# Patient Record
Sex: Female | Born: 1985 | Hispanic: Yes | Marital: Single | Smoking: Never smoker
Health system: Southern US, Community
[De-identification: ages and names within clinical notes are randomized; demographics above are authoritative.]

## PROBLEM LIST (undated history)

## (undated) DIAGNOSIS — D649 Anemia, unspecified: Secondary | ICD-10-CM

## (undated) HISTORY — DX: Anemia, unspecified: D64.9

---
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
## 2021-01-05 ENCOUNTER — Encounter: Payer: Self-pay | Admitting: *Deleted

## 2021-01-05 ENCOUNTER — Other Ambulatory Visit: Payer: Self-pay

## 2021-01-05 ENCOUNTER — Emergency Department: Payer: Worker's Compensation

## 2021-01-05 DIAGNOSIS — W010XXA Fall on same level from slipping, tripping and stumbling without subsequent striking against object, initial encounter: Secondary | ICD-10-CM | POA: Insufficient documentation

## 2021-01-05 DIAGNOSIS — Y990 Civilian activity done for income or pay: Secondary | ICD-10-CM | POA: Insufficient documentation

## 2021-01-05 DIAGNOSIS — S8992XA Unspecified injury of left lower leg, initial encounter: Secondary | ICD-10-CM | POA: Diagnosis present

## 2021-01-05 DIAGNOSIS — S8002XA Contusion of left knee, initial encounter: Secondary | ICD-10-CM | POA: Insufficient documentation

## 2021-01-05 IMAGING — CR DG KNEE COMPLETE 4+V*L*
1 series · 4 of 4 positions shown · non-contrast
Comparison: None.

CLINICAL DATA: Injury to the left knee.

EXAM:
LEFT KNEE - COMPLETE 4+ VIEW

[Series 1: dg knee complete 4 views left · 0.14mm/px · 4 of 4 slices shown]
[im 1/4]
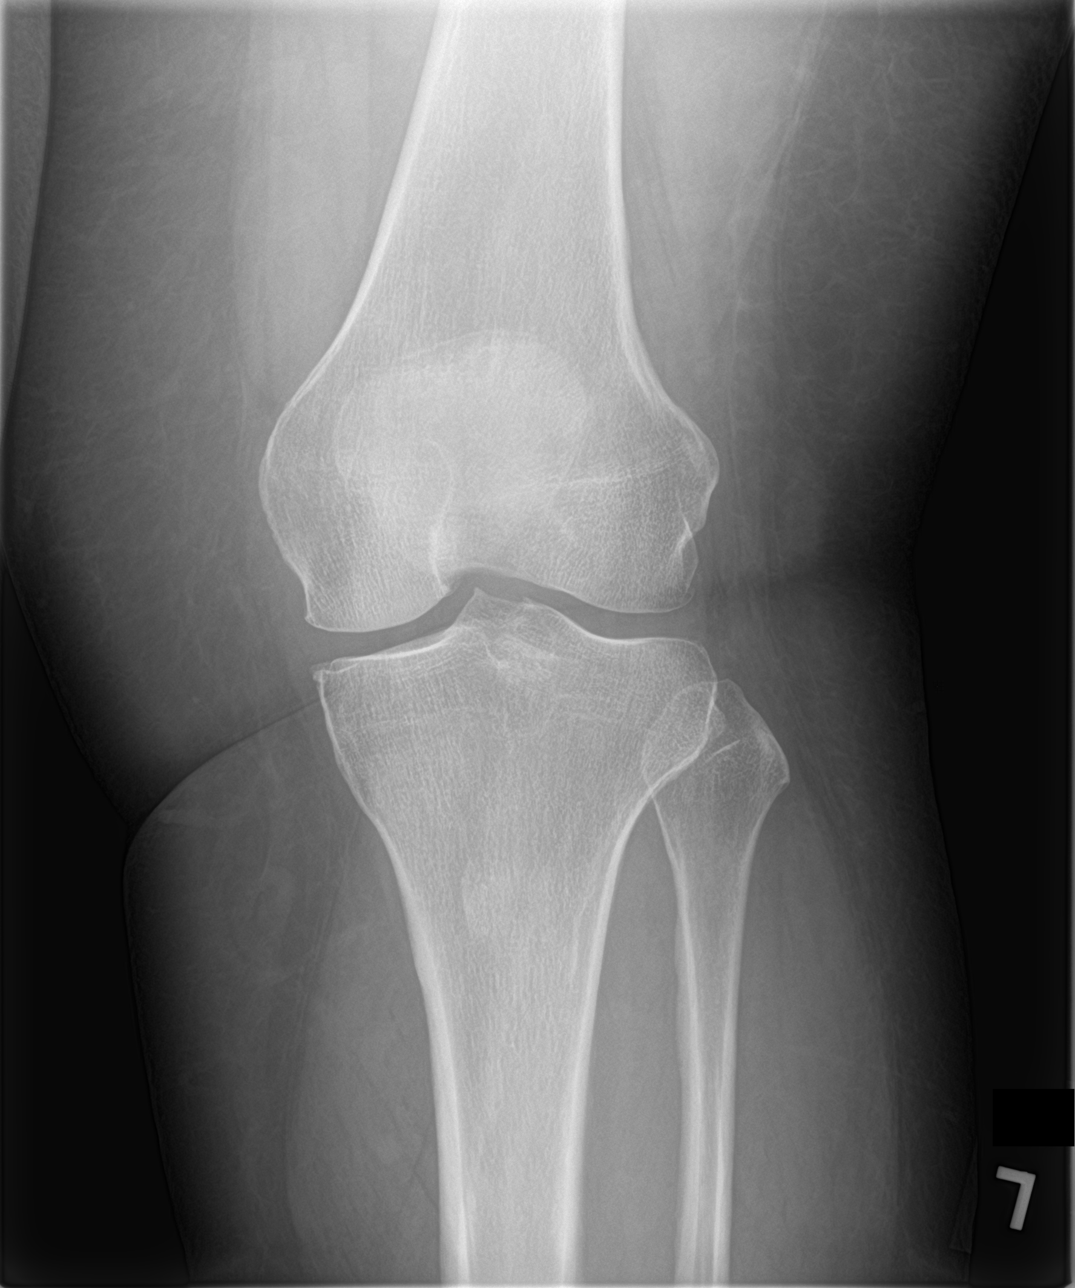
[im 2/4]
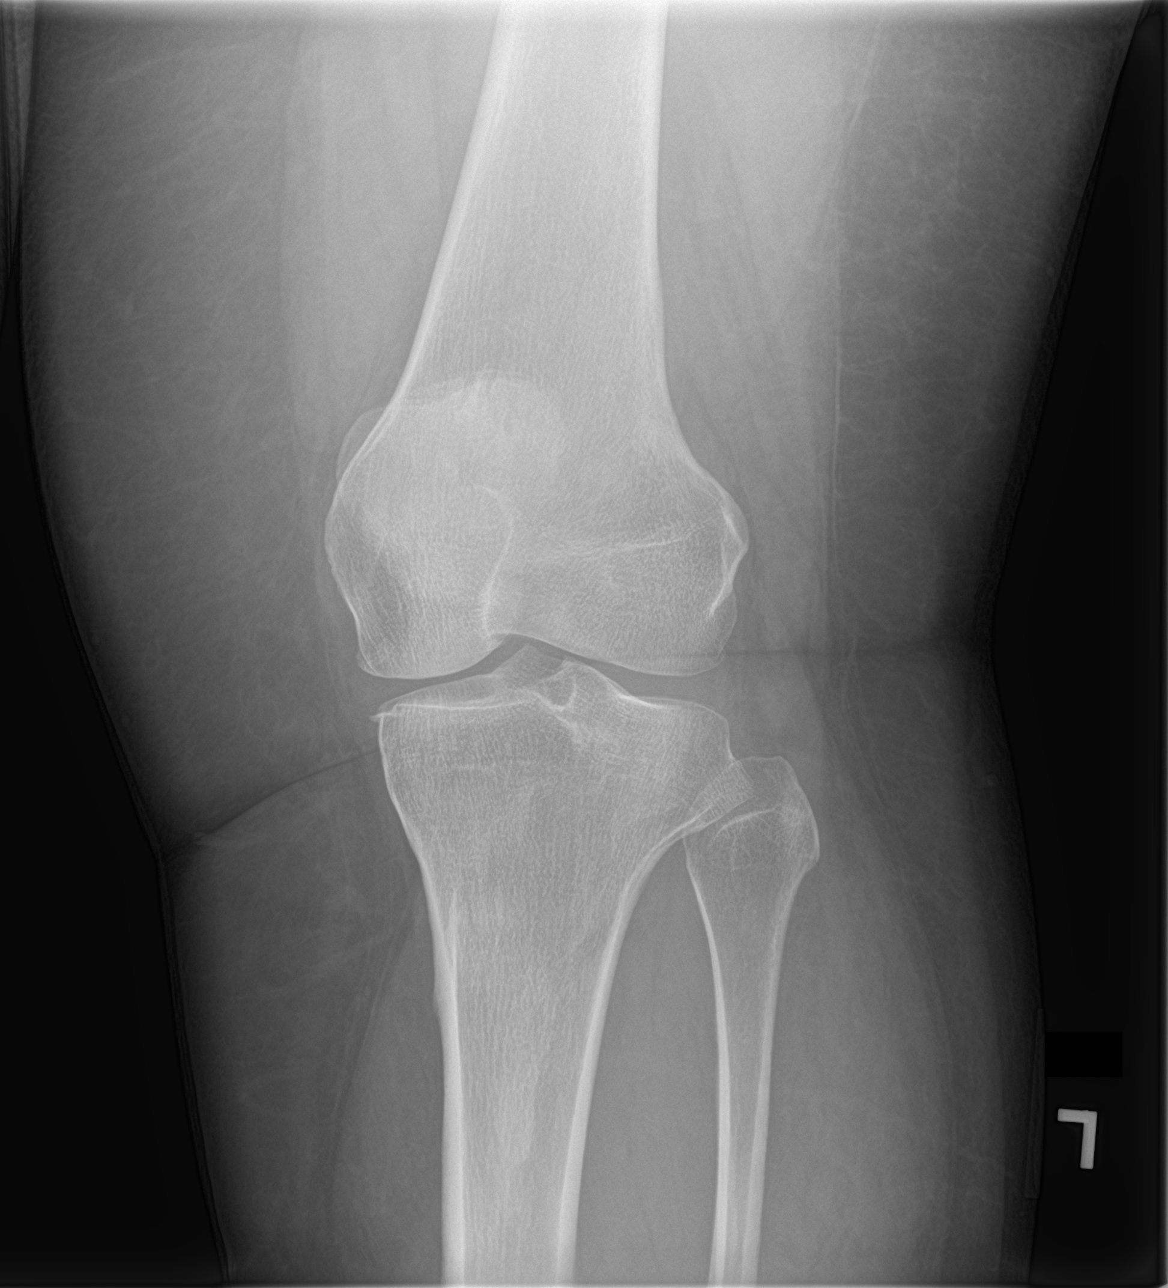
[im 3/4]
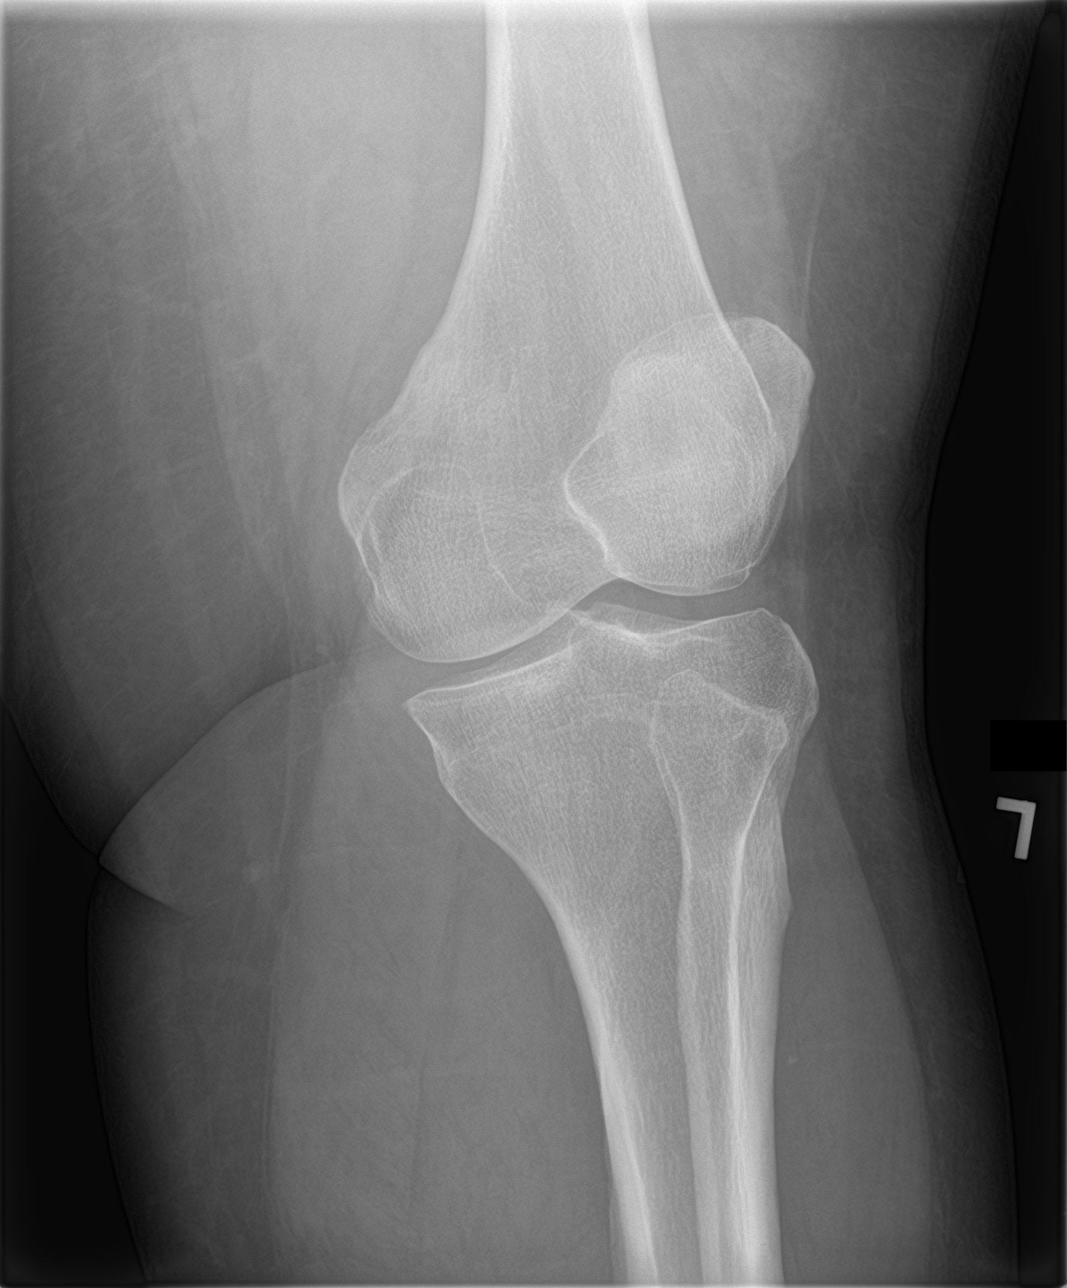
[im 4/4]
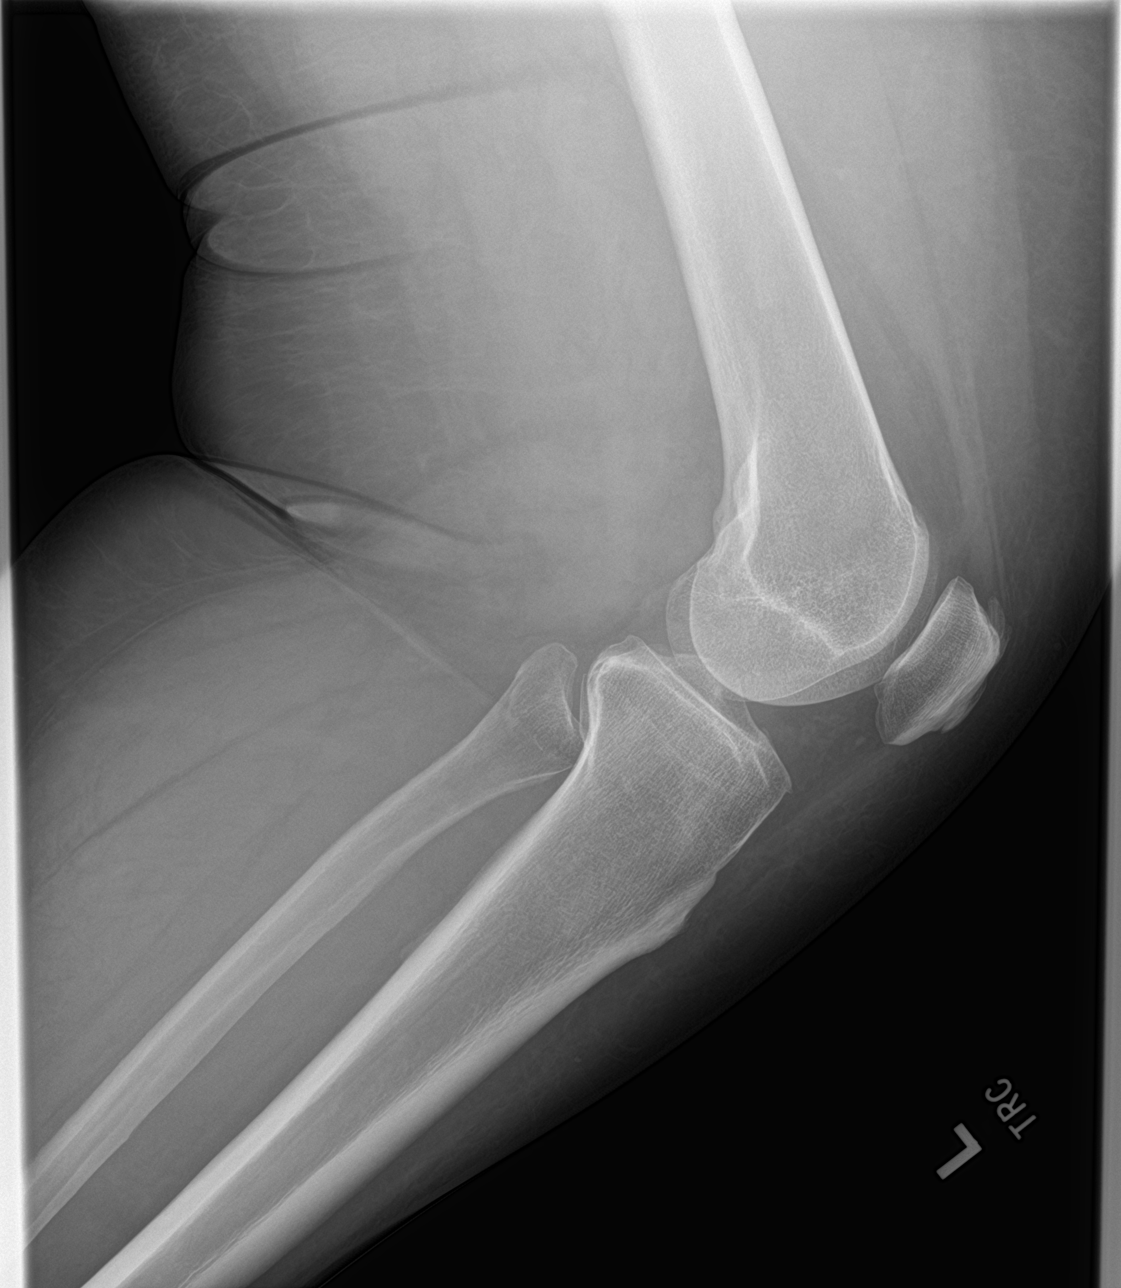

[4 of 4 positions shown; findings below may reference images not displayed]

FINDINGS: No evidence of fracture, dislocation, or joint effusion. No evidence
of arthropathy or other focal bone abnormality. Soft tissues are
unremarkable.
IMPRESSION: Negative.

## 2021-01-05 NOTE — ED Triage Notes (Signed)
Pt has left knee pain  pt fell at work today. Denies other injury.  Pt alert  speech clear.  Interpreter on a stick used in triage.

## 2021-01-06 ENCOUNTER — Emergency Department
Admission: EM | Admit: 2021-01-06 | Discharge: 2021-01-06 | Disposition: A | Discharge status: Home / Self Care | Payer: Worker's Compensation | Attending: Emergency Medicine | Admitting: Emergency Medicine

## 2021-01-06 DIAGNOSIS — S8002XA Contusion of left knee, initial encounter: Secondary | ICD-10-CM

## 2021-01-06 DIAGNOSIS — W19XXXA Unspecified fall, initial encounter: Secondary | ICD-10-CM

## 2021-01-06 MED ORDER — IBUPROFEN 600 MG PO TABS: 600.0000 mg | ORAL_TABLET | Freq: Four times a day (QID) | ORAL | 0 refills | Status: AC | PRN

## 2021-01-06 NOTE — ED Notes (Signed)
See triage note  presents s/p fall  states she fell at work  having pain to left knee  unable to bear full wt  min swelling noted

## 2021-01-06 NOTE — Discharge Instructions (Signed)
Follow-up with Dr. Okey Dupre if any continued problems with your knee.  A prescription for ibuprofen was sent to the pharmacy for you to take.  Ice and elevate as we discussed.  Also wear the knee immobilizer for 7 to 10 days for added protection of your knee.  A note for work was also written.

## 2021-01-06 NOTE — ED Notes (Signed)
Assessment completed using interpreter [phone] Pt reports working at [employer] and falling yesterday onto left knee.

## 2021-01-06 NOTE — ED Provider Notes (Signed)
[facility] Emergency Department Provider Note  ____________________________________________   Event Date/Time   First MD Initiated Contact with Patient 01/06/21 [id number]     (approximate)  I have reviewed the triage vital signs and the nursing notes.   HISTORY  Chief Complaint Knee Injury Spanish interpreter via [employer] was used.  HPI Christine Cline is a 35 y.o. female presents to the ED with complaint of Workmen's Comp. injury that occurred last evening.  Patient states that she slipped on something that was on the floor and has continued to have pain to her left knee since that time.  She denies any head injury or loss of consciousness with her fall.  She denies any previous injury to her knee.  Currently she rates her pain as an 8 out of 10.         No past medical history on file.  There are no problems to display for this patient.   Prior to Admission medications   Medication Sig Start Date End Date Taking? Authorizing Provider  ibuprofen (ADVIL) 600 MG tablet Take 1 tablet (600 mg total) by mouth every 6 (six) hours as needed for moderate pain. 01/06/21  Yes Tommi Rumps, PA-C    Allergies Patient has no known allergies.  No family history on file.  Social History Social History   Tobacco Use   Smoking status: Never   Smokeless tobacco: Never  Substance Use Topics   Alcohol use: Not Currently    Review of Systems Constitutional: No fever/chills Eyes: No visual changes. ENT: No trauma. Cardiovascular: Denies chest pain. Respiratory: Denies shortness of breath. Gastrointestinal: No abdominal pain.  No nausea, no vomiting.   Genitourinary: Negative for dysuria. Musculoskeletal: Left knee pain positive. Skin: Negative for rash. Neurological: Negative for headaches, focal weakness or numbness.  ____________________________________________   PHYSICAL EXAM:  VITAL SIGNS: ED Triage Vitals [01/05/21 2238]  Enc Vitals Group      BP 126/76     Pulse Rate 79     Resp 20     Temp 99.6 F (37.6 C)     Temp Source Oral     SpO2 98 %     Weight 280 lb (127 kg)     Height 5\' 5"  (1.651 m)     Head Circumference      Peak Flow      Pain Score 8     Pain Loc      Pain Edu?      Excl. in GC?     Constitutional: Alert and oriented. Well appearing and in no acute distress. Eyes: Conjunctivae are normal.  Head: Atraumatic. Nose: No trauma. Mouth/Throat: No trauma. Neck: No stridor.   Cardiovascular: Normal rate, regular rhythm. Grossly normal heart sounds.  Good peripheral circulation. Respiratory: Normal respiratory effort.  No retractions. Lungs CTAB. Gastrointestinal: Soft and nontender. No distention.  Musculoskeletal: On examination of the left knee there is no obvious deformity however there is moderate amount of soft tissue tenderness anteriorly over the patella.  No soft tissue edema is appreciated due to body habitus.  Range of motion is slow and guarded secondary to pain.  Skin is intact.  No discoloration present.  Motor sensory function intact distal to the injury.  Pulses present. Neurologic:  Normal speech and language. No gross focal neurologic deficits are appreciated. No gait instability. Skin:  Skin is warm, dry and intact.  Psychiatric: Mood and affect are normal. Speech and behavior are normal.  ____________________________________________  LABS (all labs ordered are listed, but only abnormal results are displayed)  Labs Reviewed - No data to display ____________________________________________ ____________________________________________  RADIOLOGY Beaulah Corin, personally viewed and evaluated these images (plain radiographs) as part of my medical decision making, as well as reviewing the written report by the radiologist.    Official radiology report(s): DG Knee Complete 4 Views Left  Result Date: 01/05/2021 CLINICAL DATA:  Injury to the left knee. EXAM: LEFT KNEE - COMPLETE  4+ VIEW COMPARISON:  None. FINDINGS: No evidence of fracture, dislocation, or joint effusion. No evidence of arthropathy or other focal bone abnormality. Soft tissues are unremarkable. IMPRESSION: Negative. Electronically Signed   By: Elgie Collard M.D.   On: 01/05/2021 22:59    ____________________________________________   PROCEDURES  Procedure(s) performed (including Critical Care):  Procedures   ____________________________________________   INITIAL IMPRESSION / ASSESSMENT AND PLAN / ED COURSE  As part of my medical decision making, I reviewed the following data within the electronic MEDICAL RECORD NUMBER Notes from prior ED visits and [place] Controlled Substance Database  35 year old female presents to the ED with complaint of left knee pain after falling at work.  Patient denies any head injury or loss of consciousness.  [employer] interpreter was used for patient and all questions were answered.  Physical exam shows moderate tenderness on palpation of the patella without soft tissue injury noted.  X-ray of the left knee is negative for acute fracture.  Patient was made aware.  Because of body habitus a knee immobilizer was unable to be placed and a Jones wrap was used instead.  Patient was made aware she should ice and elevate her knee as needed for pain and for swelling.  She is to follow-up if any continued problems.  A prescription for ibuprofen 600 mg every 6 hours as needed for pain and inflammation was sent to the pharmacy.  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 injury.  Information about the orthopedist on-call was also given to the patient to follow-up if not improving.   ____________________________________________   FINAL CLINICAL IMPRESSION(S) / ED DIAGNOSES  Final diagnoses:  Contusion of left knee, initial encounter  Fall, initial encounter     ED Discharge Orders          Ordered    ibuprofen (ADVIL) 600 MG tablet  Every 6  hours PRN        01/06/21 0803             Note:  This document was prepared using Dragon voice recognition software and may include unintentional dictation errors.    Tommi Rumps, PA-C 01/06/21 1059    Sharman Cheek, MD 01/06/21 1228

## 2021-05-12 ENCOUNTER — Ambulatory Visit (LOCAL_COMMUNITY_HEALTH_CENTER): Payer: Self-pay

## 2021-05-12 ENCOUNTER — Other Ambulatory Visit: Payer: Self-pay

## 2021-05-12 VITALS — BP 140/92 | Ht 62.99 in | Wt 307.0 lb

## 2021-05-12 DIAGNOSIS — Z3201 Encounter for pregnancy test, result positive: Secondary | ICD-10-CM

## 2021-05-12 LAB — PREGNANCY, URINE: Preg Test, Ur: POSITIVE — AB

## 2021-05-12 MED ORDER — PRENATAL 27-0.8 MG PO TABS: 1.0000 | ORAL_TABLET | Freq: Every day | ORAL | 0 refills | Status: AC

## 2021-05-12 NOTE — Progress Notes (Signed)
UPT positive. Plans prenatal care at [facility]. BP elevated today at 140/92. Pt reports "this happens" when she goes to doctor. Denies hx HBP. PHQ-2 = 13 today. Pt unsure whether symptoms related to pregnancy or possibly depression. Denies thoughts of hurting self. No hx depression/anxiety. Admits partner and friends supportive. Pt declines to meet with provider today.  ? ?Consult A White, FNP who recommends RN to give pt contact card for A Marvin, LCSW and for pt to establish prenatal care. RN counseled pt on provider recommendations. Card for [employer], LCSW given. Pt sent to clerk for preadmit. Lang line, interpreter. Jerel Shepherd, RN ? ?

## 2021-06-02 ENCOUNTER — Encounter: Payer: Self-pay | Admitting: Advanced Practice Midwife

## 2021-06-02 ENCOUNTER — Ambulatory Visit: Payer: Medicaid Other | Admitting: Advanced Practice Midwife

## 2021-06-02 VITALS — BP 135/89 | HR 75 | Temp 99.9°F | Ht 63.0 in | Wt 301.2 lb

## 2021-06-02 DIAGNOSIS — R69 Illness, unspecified: Secondary | ICD-10-CM

## 2021-06-02 NOTE — Progress Notes (Signed)
Presents for [facility] IP appt with fever of 99.9 and frequent congested cough. States chest is still hurting. Mask on when called to clinic. Client has after visit summary from [facility] (walk-in clinic) 06/01/21 and was started on Azithromycin and Prednisone for brondchitis and Zofran for nausea. Per consult with E. Sciora CNM, [facility] IP appt needs to be rescheduled. Appt rescheduled for 06/09/2021 and reminder card given. Client given Covid test kit and to self test at home today and notify clinic if positive result. Rich Number, RN ? ?Consulted on the plan of care for this client.  I agree with the documented note and actions taken to provide care for this client.  Ola Spurr, CNM ? ?

## 2021-06-09 ENCOUNTER — Ambulatory Visit: Payer: Medicaid Other | Admitting: Advanced Practice Midwife

## 2021-06-09 ENCOUNTER — Encounter: Payer: Self-pay | Admitting: Advanced Practice Midwife

## 2021-06-09 DIAGNOSIS — Z23 Encounter for immunization: Secondary | ICD-10-CM | POA: Diagnosis not present

## 2021-06-09 DIAGNOSIS — O09511 Supervision of elderly primigravida, first trimester: Secondary | ICD-10-CM | POA: Diagnosis not present

## 2021-06-09 DIAGNOSIS — O99211 Obesity complicating pregnancy, first trimester: Secondary | ICD-10-CM

## 2021-06-09 DIAGNOSIS — O0991 Supervision of high risk pregnancy, unspecified, first trimester: Secondary | ICD-10-CM

## 2021-06-09 DIAGNOSIS — O161 Unspecified maternal hypertension, first trimester: Secondary | ICD-10-CM

## 2021-06-09 DIAGNOSIS — O09519 Supervision of elderly primigravida, unspecified trimester: Secondary | ICD-10-CM

## 2021-06-09 DIAGNOSIS — O99210 Obesity complicating pregnancy, unspecified trimester: Secondary | ICD-10-CM

## 2021-06-09 LAB — URINALYSIS
Bilirubin, UA: NEGATIVE
Glucose, UA: NEGATIVE
Ketones, UA: NEGATIVE
Leukocytes,UA: NEGATIVE
Nitrite, UA: NEGATIVE
Protein,UA: NEGATIVE
RBC, UA: NEGATIVE
Specific Gravity, UA: 1.02 (ref 1.005–1.030)
Urobilinogen, Ur: 0.2 mg/dL (ref 0.2–1.0)
pH, UA: 7 (ref 5.0–7.5)

## 2021-06-09 LAB — HEMOGLOBIN, FINGERSTICK: Hemoglobin: 11.6 g/dL (ref 11.1–15.9)

## 2021-06-09 LAB — WET PREP FOR TRICH, YEAST, CLUE
Trichomonas Exam: NEGATIVE
Yeast Exam: NEGATIVE

## 2021-06-09 NOTE — Progress Notes (Signed)
In house labs reviewed with provider, no new orders. Flu vaccine given, tolerated well, VIS given, [facility] account created and [facility] in accordian folder. [facility] U[facility] referral faxed with confirmation and patient counseled to expect call from [facility] to schedule appointment. Dental list, [facility]unker card, Peds list and [facility] pamphlet given. Burt Knack, RN  ?

## 2021-06-09 NOTE — Progress Notes (Signed)
Patient here for new OB at about 12 2/7. Living with FOB. She states he is excited about this pregnancy. He has other children. Patient was diagnosed with bronchitis 06/01/21 and was prescribed Azithromycin, Prednisone and Zofran (for nausea). Patient states she took all the medication for bronchitis and feels much better. States she had 2 covid vaccines when she came through immigration in September 2022, but she can't find her card. High BMI labs today and needs Pap. Desires flu vaccine today. Needs to be to lab by 10:20 for blood draw.Burt Knack, RN  ?

## 2021-06-09 NOTE — Progress Notes (Signed)
[facility] Department  ?Maternal Health Clinic ? ? ?INITIAL PRENATAL VISIT NOTE ? ?Subjective:  ?Christine Cline is a 37 y.o. SHF nonsmoker G2P0010 at [redacted]w[redacted]d being seen today to start prenatal care at the [facility] Department.  She feels "happy" about surprise pregnancy with no birth control. 36 yo employed FOB feels "happy" about pregnancy; in supportive 7 mo relationship; he has 2 children (20,11) who do not live with him.  Been to urgent care x1 on 06/01/21 dx'd with bronchitis and given Prednisone, Azithromycin, and Zofran. Denies any u/s this pregnancy. LMP 03/15/21. She is working 30 hrs/wk at [employer] and living with FOB. Been in [place] x 7 months from [place].  Denies cigs, vaping, cigars, MJ. Last ETOH 03/18/21 (2 beers). Last dental exam 08/2020.  She is currently monitored for the following issues for this high-risk pregnancy and has Obesity affecting pregnancy BMI=53.7   303 lbs; Advanced maternal age 65 yo; Elevated blood pressure affecting pregnancy in first trimester, antepartum 134/92 on 06/09/21; and Supervision of high risk pregnancy in first trimester on their problem list. ? ?Patient reports no complaints.  Contractions: Not present. Vag. Bleeding: None.  Movement: Absent. Denies leaking of fluid.  ? ?Indications for ASA therapy (per uptodate) ?One of the following: ?Previous pregnancy with preeclampsia, especially early onset and with an adverse outcome No ?Multifetal gestation No ?Chronic hypertension Yes ?Type 1 or 2 diabetes mellitus No ?Chronic kidney disease No ?Autoimmune disease (antiphospholipid syndrome, systemic lupus erythematosus) No ? ?Two or more of the following: ?Nulliparity No ?Obesity (body mass index >30 kg/m2) Yes ?Family history of preeclampsia in mother or sister No ?Age ?35 years Yes ?Sociodemographic characteristics (African American race, low socioeconomic level) Yes ?Personal risk factors (eg, previous pregnancy with low birth weight or small for  gestational age infant, previous adverse pregnancy outcome [eg, stillbirth], interval >10 years between pregnancies) No ? ? ?The following portions of the patient's history were reviewed and updated as appropriate: allergies, current medications, past family history, past medical history, past social history, past surgical history and problem list. Problem list updated. ? ?Objective:  ? ?Vitals:  ? 06/09/21 0841  ?BP: (!) 134/92  ?Pulse: 76  ?Temp: 99.1 ?F (37.3 ?C)  ?Weight: (!) 303 lb 9.6 oz (137.7 kg)  ? ? ?Fetal Status: Fetal Heart Rate (bpm): not heard Fundal Height: 0 cm Movement: Absent  Presentation: Undeterminable ? ? ?Physical Exam ?Vitals and nursing note reviewed.  ?Constitutional:   ?   General: She is not in acute distress. ?   Appearance: Normal appearance. She is well-developed. She is obese.  ?HENT:  ?   Head: Normocephalic and atraumatic.  ?   Right Ear: External ear normal.  ?   Left Ear: External ear normal.  ?   Nose: Nose normal. No congestion or rhinorrhea.  ?   Mouth/Throat:  ?   Lips: Pink.  ?   Mouth: Mucous membranes are moist.  ?   Dentition: Normal dentition. No dental caries.  ?   Pharynx: Oropharynx is clear. Uvula midline.  ?   Comments: Dentition: fair; last dental exam 08/2020 ?Eyes:  ?   General: No scleral icterus. ?   Conjunctiva/sclera: Conjunctivae normal.  ?Neck:  ?   Thyroid: No thyroid mass, thyromegaly or thyroid tenderness.  ?Cardiovascular:  ?   Rate and Rhythm: Normal rate.  ?   Pulses: Normal pulses.  ?   Comments: Extremities are warm and well perfused ?Pulmonary:  ?   Effort:  Pulmonary effort is normal.  ?   Breath sounds: Normal breath sounds.  ?Chest:  ?   Chest wall: No mass.  ?Breasts: ?   Tanner Score is 5.  ?   Breasts are symmetrical.  ?   Right: Normal. No mass, nipple discharge or skin change.  ?   Left: Normal. No mass, nipple discharge or skin change.  ? ? ?   Comments: Old scar from removal of benign mass age 89 ?Abdominal:  ?   Palpations: Abdomen is soft.   ?   Tenderness: There is no abdominal tenderness.  ?   Comments: Gravid, poor tone, large pannus, unable to hear FHR or assess FH due to increased adipose  ?Genitourinary: ?   General: Normal vulva.  ?   Exam position: Lithotomy position.  ?   Pubic Area: No rash.   ?   Labia:     ?   Right: No rash.     ?   Left: No rash.   ?   Vagina: Vaginal discharge (white creamy leukorrhea, ph<4.5) present.  ?   Cervix: Friability (friable to pap) present. No cervical motion tenderness.  ?   Uterus: Enlarged (Gravid unable to assess due to pannus, increased adipose). Not tender.   ?   Rectum: Normal. No external hemorrhoid.  ?   Comments: Needed to use long metal speculum in order to visualize posterior cx for pap ?Musculoskeletal:  ?   Right lower leg: No edema.  ?   Left lower leg: No edema.  ?Lymphadenopathy:  ?   Cervical: No cervical adenopathy.  ?   Upper Body:  ?   Right upper body: No axillary adenopathy.  ?   Left upper body: No axillary adenopathy.  ?Skin: ?   General: Skin is warm.  ?   Capillary Refill: Capillary refill takes less than 2 seconds.  ?Neurological:  ?   Mental Status: She is alert.  ? ? ?Assessment and Plan:  ?Pregnancy: G2P0010 at [redacted]w[redacted]d ? ?1. Obesity affecting pregnancy, antepartum ?Accepts ASA 81 mg daily ?Needs delivery at [facility] due to high BMI ?Counseled on weight gain of 11-20 lbs this pregnancy ? ?2. Advanced maternal age, primigravida, antepartum ?Agrees to genetic counseling and NIPS ? ?3. Elevated blood pressure affecting pregnancy in first trimester, antepartum 134/92 on 06/09/21 and 140/92 at +PT on 05/12/21 ?Please repeat BP today; may have CHTN ?May need transfer of care due to [facility] ? ?4. Supervision of high risk pregnancy in first trimester ?Needs 1 hour glucola today ?U/s ordered for dating/viability ?Please give pt dental list ? ?- Chlamydia/GC NAA, Confirmation ?- Comprehensive metabolic panel ?- Glucose tolerance, 1 hour ?- HCV Ab w Reflex to Quant PCR ?- Hgb Fractionation Cascade ?-  Urine Culture ?- TSH ?- QuantiFERON-TB Gold Plus ?- Protein / creatinine ratio, urine ?- CBC/D/Plt+RPR+Rh+ABO+Rub Ab... ?- Lead, blood (adult age 42 yrs or greater) ?- HIV-1/HIV-2 Qualitative RNA ?- Hgb A1c w/o eAG ?- [id number] Drug Screen ?- WET PREP FOR TRICH, YEAST, CLUE ?- Urinalysis (Urine Dip) ?- Hemoglobin, venipuncture ?- IGP, Aptima HPV ? ? ? ?Discussed overview of care and coordination with inpatient delivery practices including [facility], [facility], [facility] and [facility].  ? ?Reviewed Centering pregnancy as standard of care at [facility] ? ? ?Preterm labor symptoms and general obstetric precautions including but not limited to vaginal bleeding, contractions, leaking of fluid and fetal movement were reviewed in detail with the patient. ? ?Please refer to After Visit Summary for other  counseling recommendations.  ? ?No follow-ups on file. ? ?No future appointments. ? ?Herbie Saxon, CNM ? ?

## 2021-06-10 LAB — CBC/D/PLT+RPR+RH+ABO+RUB AB...
Antibody Screen: NEGATIVE
Basophils Absolute: 0 10*3/uL (ref 0.0–0.2)
Basos: 0 %
EOS (ABSOLUTE): 0.2 10*3/uL (ref 0.0–0.4)
Eos: 2 %
Hematocrit: 35.8 % (ref 34.0–46.6)
Hemoglobin: 11.7 g/dL (ref 11.1–15.9)
Hepatitis B Surface Ag: NEGATIVE
Immature Grans (Abs): 0 10*3/uL (ref 0.0–0.1)
Immature Granulocytes: 0 %
Lymphocytes Absolute: 2.1 10*3/uL (ref 0.7–3.1)
Lymphs: 22 %
MCH: 29.1 pg (ref 26.6–33.0)
MCHC: 32.7 g/dL (ref 31.5–35.7)
MCV: 89 fL (ref 79–97)
Monocytes Absolute: 0.7 10*3/uL (ref 0.1–0.9)
Monocytes: 7 %
Neutrophils Absolute: 6.3 10*3/uL (ref 1.4–7.0)
Neutrophils: 69 %
Platelets: 325 10*3/uL (ref 150–450)
RBC: 4.02 x10E6/uL (ref 3.77–5.28)
RDW: 13.8 % (ref 11.7–15.4)
RPR Ser Ql: NONREACTIVE
Rh Factor: POSITIVE
Rubella Antibodies, IGG: 1.84 index (ref >0.99)
Varicella zoster IgG: 713 index (ref >165)
WBC: 9.2 10*3/uL (ref 3.4–10.8)

## 2021-06-10 LAB — HCV INTERPRETATION

## 2021-06-10 LAB — HCV AB W REFLEX TO QUANT PCR: HCV Ab: NONREACTIVE

## 2021-06-11 LAB — PROTEIN / CREATININE RATIO, URINE
Creatinine, Urine: 61.4 mg/dL
Protein, Ur: 7.3 mg/dL
Protein/Creat Ratio: 119 mg/g creat (ref 0–200)

## 2021-06-11 LAB — CHLAMYDIA/GC NAA, CONFIRMATION
Chlamydia trachomatis, NAA: NEGATIVE
Neisseria gonorrhoeae, NAA: NEGATIVE

## 2021-06-11 LAB — 789231 7+OXYCODONE-BUND
Amphetamines, Urine: NEGATIVE ng/mL
BENZODIAZ UR QL: NEGATIVE ng/mL
Barbiturate screen, urine: NEGATIVE ng/mL
Cannabinoid Quant, Ur: NEGATIVE ng/mL
Cocaine (Metab.): NEGATIVE ng/mL
OPIATE SCREEN URINE: NEGATIVE ng/mL
Oxycodone/Oxymorphone, Urine: NEGATIVE ng/mL
PCP Quant, Ur: NEGATIVE ng/mL

## 2021-06-12 ENCOUNTER — Telehealth: Payer: Self-pay

## 2021-06-12 LAB — IGP, APTIMA HPV
HPV Aptima: NEGATIVE
PAP Smear Comment: 0

## 2021-06-12 LAB — COMPREHENSIVE METABOLIC PANEL
ALT: 22 IU/L (ref 0–32)
AST: 11 IU/L (ref 0–40)
Albumin/Globulin Ratio: 1.2 (ref 1.2–2.2)
Albumin: 3.7 g/dL — ABNORMAL LOW (ref 3.8–4.8)
Alkaline Phosphatase: 72 IU/L (ref 44–121)
BUN/Creatinine Ratio: 12 (ref 9–23)
BUN: 6 mg/dL (ref 6–20)
Bilirubin Total: 0.3 mg/dL (ref 0.0–1.2)
CO2: 26 mmol/L (ref 20–29)
Calcium: 9 mg/dL (ref 8.7–10.2)
Chloride: 98 mmol/L (ref 96–106)
Creatinine, Ser: 0.49 mg/dL — ABNORMAL LOW (ref 0.57–1.00)
Globulin, Total: 3.1 g/dL (ref 1.5–4.5)
Glucose: 102 mg/dL — ABNORMAL HIGH (ref 70–99)
Potassium: 3.8 mmol/L (ref 3.5–5.2)
Sodium: 137 mmol/L (ref 134–144)
Total Protein: 6.8 g/dL (ref 6.0–8.5)
eGFR: 124 mL/min/{1.73_m2} (ref >59)

## 2021-06-12 LAB — QUANTIFERON-TB GOLD PLUS
QuantiFERON Mitogen Value: >10 IU/mL
QuantiFERON Nil Value: 0.02 IU/mL
QuantiFERON TB1 Ag Value: 0.08 IU/mL
QuantiFERON TB2 Ag Value: 0.04 IU/mL
QuantiFERON-TB Gold Plus: NEGATIVE

## 2021-06-12 LAB — URINE CULTURE

## 2021-06-12 LAB — LEAD, BLOOD (ADULT >= 16 YRS): Lead-Whole Blood: <1 ug/dL (ref 0.0–3.4)

## 2021-06-12 LAB — HIV-1/HIV-2 QUALITATIVE RNA
HIV-1 RNA, Qualitative: NONREACTIVE
HIV-2 RNA, Qualitative: NONREACTIVE

## 2021-06-12 LAB — HGB FRACTIONATION CASCADE
Hgb A2: 2.4 % (ref 1.8–3.2)
Hgb A: 97.6 % (ref 96.4–98.8)
Hgb F: 0 % (ref 0.0–2.0)
Hgb S: 0 %

## 2021-06-12 LAB — TSH: TSH: 1.03 u[IU]/mL (ref 0.450–4.500)

## 2021-06-12 LAB — HGB A1C W/O EAG: Hgb A1c MFr Bld: 5.7 % — ABNORMAL HIGH (ref 4.8–5.6)

## 2021-06-12 LAB — GLUCOSE, 1 HOUR GESTATIONAL: Gestational Diabetes Screen: 105 mg/dL (ref 70–139)

## 2021-06-12 NOTE — Telephone Encounter (Signed)
Client needs BP check in [facility] ASAP. Call to client and emergency contact with [employer] and per recorded messages, voicemail is not set up (noted on pink sticky to request voicemail set up). Rich Number, RN ? ?

## 2021-06-12 NOTE — Telephone Encounter (Signed)
Call to client to schedule BP check in [facility] next week and per recorded message, voicemail box is not set up. Call to spouse (emergency contact) and now able to leave a voicemail message. Message left requesting he assist [place] in contacting client to call next week as needs [facility] appt for a BP check. Number to call provided. [employer] ID # [id number] used during call. Jossie Ng, RN ? ?

## 2021-06-15 ENCOUNTER — Ambulatory Visit: Payer: Medicaid Other | Admitting: Nurse Practitioner

## 2021-06-15 VITALS — BP 118/83 | HR 84 | Temp 97.7°F | Wt 303.0 lb

## 2021-06-15 DIAGNOSIS — O0991 Supervision of high risk pregnancy, unspecified, first trimester: Secondary | ICD-10-CM

## 2021-06-15 NOTE — Progress Notes (Signed)
Aware of [facility] [place] appt 06/17/2021 and [facility] RV appt 07/07/21. Jossie Ng, RN ?BP today = 118/83 taken with large cuff (automatic). BP taken ~ 8 minutes after client was sitting in exam room. Jossie Ng, RN ? ? ?

## 2021-06-15 NOTE — Progress Notes (Signed)
36 year old female in clinic today for BP check only.  During initial OB visit BP elevated, potential risk for CHTN.  BP today 118/83.  Patient denies headache, floaters, and dizziness. Will continue to monitor.  Next RV appointment scheduled for 07/07/21. Glenna Fellows, FNP  ?

## 2021-06-15 NOTE — Telephone Encounter (Signed)
Call to client with [facility] [employer] ID # [phone] to schedule a BP check in [facility]. Per client, her company is not working today and she will have a ride after 2 pm today. Appt scheduled for this pm with arrival time of 1500. Rich Number, RN ? ?

## 2021-06-25 ENCOUNTER — Encounter: Payer: Self-pay | Admitting: Family Medicine

## 2021-06-25 NOTE — Progress Notes (Signed)
Dating [place] updated ? ?Wendi Snipes, FNP ? ?

## 2021-07-07 ENCOUNTER — Ambulatory Visit: Payer: Medicaid Other | Admitting: Family Medicine

## 2021-07-07 VITALS — BP 142/96 | HR 78 | Temp 98.7°F | Wt 307.6 lb

## 2021-07-07 DIAGNOSIS — O99210 Obesity complicating pregnancy, unspecified trimester: Secondary | ICD-10-CM

## 2021-07-07 DIAGNOSIS — O0991 Supervision of high risk pregnancy, unspecified, first trimester: Secondary | ICD-10-CM

## 2021-07-07 DIAGNOSIS — O161 Unspecified maternal hypertension, first trimester: Secondary | ICD-10-CM

## 2021-07-07 LAB — URINALYSIS
Bilirubin, UA: NEGATIVE
Glucose, UA: NEGATIVE
Ketones, UA: NEGATIVE
Leukocytes,UA: NEGATIVE
Nitrite, UA: NEGATIVE
Protein,UA: NEGATIVE
RBC, UA: NEGATIVE
Specific Gravity, UA: 1.02 (ref 1.005–1.030)
Urobilinogen, Ur: 0.2 mg/dL (ref 0.2–1.0)
pH, UA: 5.5 (ref 5.0–7.5)

## 2021-07-07 NOTE — Progress Notes (Signed)
BP rechecked, 142/96. Documented under vitals tab and notified provider Elveria Rising, FNP. Patient scheduled for a 1 week follow up BP check per provider order. Tawny Hopping, RN ? ?

## 2021-07-07 NOTE — Progress Notes (Addendum)
Here today for 16.2 week MH RV. Taking PNV and ASA QD. Denies ED/hospital visits since last RV. Declined Quad screen. UA today. Tawny Hopping, RN ? ?

## 2021-07-08 NOTE — Progress Notes (Signed)
[facility] Department ?Maternal Health Clinic ? ?PRENATAL VISIT NOTE ? ?Subjective:  ?Christine Cline is a 36 y.o. G2P0010 at [redacted]w[redacted]d being seen today for ongoing prenatal care.  She is currently monitored for the following issues for this high-risk pregnancy and has Obesity affecting pregnancy BMI=53.7   303 lbs; Advanced maternal age 46 yo and paternal age 35 yo; Elevated blood pressure affecting pregnancy in first trimester, antepartum 134/92  on 06/09/21; 140/92 on 05/12/21 at PT; and Supervision of high risk pregnancy in first trimester on their problem list. ? ?Patient reports  right sided "pinching and slight pain" .  Contractions: Not present. Vag. Bleeding: None.  Movement: Absent. Denies leaking of fluid/ROM.  ? ?The following portions of the patient's history were reviewed and updated as appropriate: allergies, current medications, past family history, past medical history, past social history, past surgical history and problem list. Problem list updated. ? ?Objective:  ? ?Vitals:  ? 07/07/21 1557 07/07/21 1709  ?BP: (!) 145/95 (!) 142/96  ?Pulse: 82 78  ?Temp: 98.7 ?F (37.1 ?C)   ?Weight: (!) 307 lb 9.6 oz (139.5 kg)   ? ? ?Fetal Status: Fetal Heart Rate (bpm): 150   Movement: Absent    ? ?General:  Alert, oriented and cooperative. Patient is in no acute distress.  ?Skin: Skin is warm and dry. No rash noted.   ?Cardiovascular: Normal heart rate noted  ?Respiratory: Normal respiratory effort, no problems with respiration noted  ?Abdomen: Soft, gravid, appropriate for gestational age.  Pain/Pressure: Present     ?Pelvic: Cervical exam deferred        ?Extremities: Normal range of motion.  Edema: None  ?Mental Status: Normal mood and affect. Normal behavior. Normal judgment and thought content.  ? ?Assessment and Plan:  ?Pregnancy: G2P0010 at [redacted]w[redacted]d ? ?1. Supervision of high risk pregnancy in first trimester ?TWG - 27 lbs  ?Taking PNV  and PNV as directed  ?Declined Quad screen ?Reviewed 06/07/21 dating  [place]  ?- Urinalysis (Urine Dip) ? ?2. Elevated blood pressure affecting pregnancy in first trimester, antepartum 134/92 on 06/09/21 ?B/p today was 145/96 retaken again manually at end of visit and was 142/96.   ?Will need to closely monitor blood pressures  ?Will have patient RTC in 1 week for blood pressure check.  ?Need anatomy scan ~ 18- 20 weeks  ? ? ?3. Obesity affecting pregnancy, antepartum ?Pt denies exercising ?Discussed diet and exercising  ? ? ?Preterm labor symptoms and general obstetric precautions including but not limited to vaginal bleeding, contractions, leaking of fluid and fetal movement were reviewed in detail with the patient. ?Please refer to After Visit Summary for other counseling recommendations.  ?No follow-ups on file. ? ?Future Appointments  ?Date Time Provider [place]  ?07/15/2021  9:00 AM [facility] PROVIDER [facility] None  ? ? ?Junious Dresser, FNP ? ?

## 2021-07-09 NOTE — Addendum Note (Signed)
Addended by: Heywood Bene on: 07/09/2021 02:08 PM ? ? Modules accepted: Orders ? ?

## 2021-07-15 ENCOUNTER — Encounter: Payer: Self-pay | Admitting: Family Medicine

## 2021-07-15 ENCOUNTER — Ambulatory Visit: Payer: Medicaid Other | Admitting: Family Medicine

## 2021-07-15 VITALS — BP 135/93 | HR 77 | Temp 98.5°F | Wt 306.8 lb

## 2021-07-15 DIAGNOSIS — O99212 Obesity complicating pregnancy, second trimester: Secondary | ICD-10-CM | POA: Diagnosis not present

## 2021-07-15 DIAGNOSIS — O162 Unspecified maternal hypertension, second trimester: Secondary | ICD-10-CM | POA: Diagnosis not present

## 2021-07-15 DIAGNOSIS — O0992 Supervision of high risk pregnancy, unspecified, second trimester: Secondary | ICD-10-CM | POA: Diagnosis not present

## 2021-07-15 DIAGNOSIS — O0991 Supervision of high risk pregnancy, unspecified, first trimester: Secondary | ICD-10-CM

## 2021-07-15 DIAGNOSIS — O161 Unspecified maternal hypertension, first trimester: Secondary | ICD-10-CM

## 2021-07-15 NOTE — Progress Notes (Signed)
[facility] Department ?Maternal Health Clinic ? ?PRENATAL VISIT NOTE ? ?Subjective:  ?Christine Cline is a 36 y.o. G2P0010 at [redacted]w[redacted]d being seen today for ongoing prenatal care.  She is currently monitored for the following issues for this high-risk pregnancy and has Obesity affecting pregnancy BMI=53.7   303 lbs; Advanced maternal age 65 yo and paternal age 108 yo; Elevated blood pressure affecting pregnancy in first trimester, antepartum 134/92  on 06/09/21; 140/92 on 05/12/21 at PT; and Supervision of high risk pregnancy in first trimester on their problem list. ? ?Patient reports headache.  Contractions: Not present. Vag. Bleeding: None.  Movement: Absent. Denies leaking of fluid/ROM.  ? ?The following portions of the patient's history were reviewed and updated as appropriate: allergies, current medications, past family history, past medical history, past social history, past surgical history and problem list. Problem list updated. ? ?Objective:  ? ?Vitals:  ? 07/15/21 0947  ?BP: (!) 135/93  ?Pulse: 77  ?Temp: 98.5 ?F (36.9 ?C)  ?Weight: (!) 306 lb 12.8 oz (139.2 kg)  ? ? ?Fetal Status: Fetal Heart Rate (bpm): 155   Movement: Absent    ? ?General:  Alert, oriented and cooperative. Patient is in no acute distress.  ?Skin: Skin is warm and dry. No rash noted.   ?Cardiovascular: Normal heart rate noted  ?Respiratory: Normal respiratory effort, no problems with respiration noted  ?Abdomen: Soft, gravid, appropriate for gestational age.  Pain/Pressure: Present     ?Pelvic: Cervical exam deferred        ?Extremities: Normal range of motion.  Edema: None  ?Mental Status: Normal mood and affect. Normal behavior. Normal judgment and thought content.  ? ?Assessment and Plan:  ?Pregnancy: G2P0010 at [redacted]w[redacted]d ? ?1. Obesity affecting pregnancy in second trimester ?Pt not exercising  ?FHT heard briefly, hard to hear d/t body habitus.   ?Unable to assess FH.   ? ?2. Elevated blood pressure affecting pregnancy  in first trimester, antepartum 134/92 on 06/09/21 ?B/P today was 135/93 ?Discussed with patient need for transfer of care d/t chronic HTN prior to pregnancy.  ?Discussed with patient need for high level of care as discussed in Maternity care conference.  ?Pt has repeat elevated blood pressures starting at initial prenatal visit.  ? ? ?Pt reports HA but denies s/sx of pre-eclampsia- no scotoma, N/V or Edema  ? ?Referral sent for transfer of care to [facility].    ? ?3. Supervision of high risk pregnancy in first trimester ?TWG- 26 lb  ?Taking PNV as directed  ?Taking ASA as directed  ?Reviewed dating [place]  ?Discuss need for anatomy US - referral sent.   ? ? ?Preterm labor symptoms and general obstetric precautions including but not limited to vaginal bleeding, contractions, leaking of fluid and fetal movement were reviewed in detail with the patient. ?Please refer to After Visit Summary for other counseling recommendations.  ?No follow-ups on file. ? ?Future Appointments  ?Date Time Provider [place]  ?08/10/2021  4:00 PM [facility] PROVIDER [facility] None  ? ?[facility] agency interpreter used for Spanish interpretation.    ? ?Junious Dresser, FNP ? ?

## 2021-07-15 NOTE — Progress Notes (Unsigned)
Patient ID: Christine Cline, female   DOB: August 22, 1985, 36 y.o.   MRN: [id number] ?[facility] Department Maternity Care Conference ? ?Maternity Care Conference Date: 07/15/21 ? ?[name] was identified by clinical staff to benefit from an interdisciplinary team approach to help improve pregnancy care.  The [facility] Maternity Care Conference includes the maternity clinic coordinator (RN), medical providers (MD/APP staff), Care Management -OBCM and Healthy Beginnings, Centering Pregnancy coordinator, Infant Mortality reduction Dietitian.  Nursing staff are also encouraged to participate. The group meets monthly to discuss patient care and coordinate services.  ? ?The patient's care care at the agency was reviewed in EMR and high risk factors evaluated in an interdisciplinary approach.   ? ?Value added interventions discussed at this care conference today were: ? ? ?[facility] Meeting 07/09/21 ?Patient has a record of high blood pressure  ?Patient is a possible transfer due to Chronic hypertension ?Provider MG will call patient to discuss  ?transfer and nurse will start paperwork.  ?L.Rojas  ?

## 2021-07-15 NOTE — Progress Notes (Signed)
Patient here for MH RV at 17 3/7. States she has a VM set up. Unsure BCM, has pamphlet. States she has chosen a Optometrist and can't remember the name. She will bring it with her at her next appointment. Burt Knack, RN  ?

## 2021-07-16 NOTE — Progress Notes (Signed)
Transfer of Care referral faxed with appointment notes and snapshot to [facility] MFM with confirmation. Marland KitchenMarland KitchenMarland KitchenBurt Knack, RN

## 2021-07-22 ENCOUNTER — Emergency Department
Admission: EM | Admit: 2021-07-22 | Discharge: 2021-07-23 | Disposition: A | Discharge status: Home / Self Care | Payer: Self-pay | Attending: Emergency Medicine | Admitting: Emergency Medicine

## 2021-07-22 ENCOUNTER — Encounter: Payer: Self-pay | Admitting: Emergency Medicine

## 2021-07-22 ENCOUNTER — Other Ambulatory Visit: Payer: Self-pay

## 2021-07-22 ENCOUNTER — Telehealth: Payer: Self-pay

## 2021-07-22 DIAGNOSIS — O09512 Supervision of elderly primigravida, second trimester: Secondary | ICD-10-CM | POA: Insufficient documentation

## 2021-07-22 DIAGNOSIS — Z3A18 18 weeks gestation of pregnancy: Secondary | ICD-10-CM | POA: Insufficient documentation

## 2021-07-22 DIAGNOSIS — O219 Vomiting of pregnancy, unspecified: Secondary | ICD-10-CM | POA: Insufficient documentation

## 2021-07-22 DIAGNOSIS — O26892 Other specified pregnancy related conditions, second trimester: Secondary | ICD-10-CM | POA: Insufficient documentation

## 2021-07-22 DIAGNOSIS — R109 Unspecified abdominal pain: Secondary | ICD-10-CM | POA: Insufficient documentation

## 2021-07-22 LAB — COMPREHENSIVE METABOLIC PANEL
ALT: 18 U/L (ref 0–44)
AST: 16 U/L (ref 15–41)
Albumin: 2.9 g/dL — ABNORMAL LOW (ref 3.5–5.0)
Alkaline Phosphatase: 54 U/L (ref 38–126)
Anion gap: 6 (ref 5–15)
BUN: 8 mg/dL (ref 6–20)
CO2: 25 mmol/L (ref 22–32)
Calcium: 8.5 mg/dL — ABNORMAL LOW (ref 8.9–10.3)
Chloride: 108 mmol/L (ref 98–111)
Creatinine, Ser: 0.59 mg/dL (ref 0.44–1.00)
GFR, Estimated: >60 mL/min (ref >60)
Glucose, Bld: 88 mg/dL (ref 70–99)
Potassium: 3.3 mmol/L — ABNORMAL LOW (ref 3.5–5.1)
Sodium: 139 mmol/L (ref 135–145)
Total Bilirubin: 0.2 mg/dL — ABNORMAL LOW (ref 0.3–1.2)
Total Protein: 6.4 g/dL — ABNORMAL LOW (ref 6.5–8.1)

## 2021-07-22 LAB — CBC
HCT: 29.2 % — ABNORMAL LOW (ref 36.0–46.0)
Hemoglobin: 9.5 g/dL — ABNORMAL LOW (ref 12.0–15.0)
MCH: 29.5 pg (ref 26.0–34.0)
MCHC: 32.5 g/dL (ref 30.0–36.0)
MCV: 90.7 fL (ref 80.0–100.0)
Platelets: 278 10*3/uL (ref 150–400)
RBC: 3.22 MIL/uL — ABNORMAL LOW (ref 3.87–5.11)
RDW: 13.5 % (ref 11.5–15.5)
WBC: 9 10*3/uL (ref 4.0–10.5)
nRBC: 0 % (ref 0.0–0.2)

## 2021-07-22 LAB — URINALYSIS, ROUTINE W REFLEX MICROSCOPIC
Bilirubin Urine: NEGATIVE
Glucose, UA: NEGATIVE mg/dL
Hgb urine dipstick: NEGATIVE
Ketones, ur: NEGATIVE mg/dL
Leukocytes,Ua: NEGATIVE
Nitrite: NEGATIVE
Protein, ur: NEGATIVE mg/dL
Specific Gravity, Urine: 1.013 (ref 1.005–1.030)
pH: 7 (ref 5.0–8.0)

## 2021-07-22 LAB — HCG, QUANTITATIVE, PREGNANCY: hCG, Beta Chain, Quant, S: 49672 m[IU]/mL — ABNORMAL HIGH (ref <5)

## 2021-07-22 LAB — LIPASE, BLOOD: Lipase: 31 U/L (ref 11–51)

## 2021-07-22 MED ORDER — SODIUM CHLORIDE 0.9 % IV BOLUS
1000.0000 mL | Freq: Once | INTRAVENOUS | Status: AC
  Administered 2021-07-22: 1000 mL via INTRAVENOUS

## 2021-07-22 MED ORDER — ACETAMINOPHEN 500 MG PO TABS
1000.0000 mg | ORAL_TABLET | Freq: Once | ORAL | Status: AC
Start: 2021-07-22 — End: 2021-07-22
  Administered 2021-07-22: 1000 mg via ORAL
  Filled 2021-07-22: qty 2

## 2021-07-22 NOTE — Telephone Encounter (Signed)
TC to patient to let her know that we are canceling her MH RV for 08/10/21 as she has her [facility] appointment at [facility] on 08/14/21. Patient states she is feeling low back pain and low abdominal pain and feels like there is "an opening in the back" and lots of pressure. When patient was asked to explain "in the back" she states she feels like there's some type of opening in her spinal column. Per consult with provider, Elveria Rising, FNP, patient needs to go to the hospital for evaluation as there is no way to check her spine at [facility]. Patient states Environmental manager, M. [place] and V. [place].Burt Knack, RN

## 2021-07-22 NOTE — ED Triage Notes (Signed)
Pt to ED from home c/o lower abd pain that started yesterday and worse today across lower abd.  Nausea without vomiting or diarrhea, denies urinary changes.  Pt is 18wks and 3 days, G1.

## 2021-07-22 NOTE — ED Provider Notes (Signed)
[facility] Provider Note    Event Date/Time   First MD Initiated Contact with Patient 07/22/21 2213     (approximate)   History   Abdominal Pain  Hospital interpreter utilized for h and p. HPI  Christine Cline is a 36 y.o. female  who, per outpatient clinic note dated 06/09/21 was being evaluated for pregnancy, who is now roughly 18 weeks who presents to the emergency department today because of concern for abdominal and back pain. Symptoms started yesterday. Have continued and gotten worse today. She states the pain is located across her lower stomach and in her back. She feels like her back is coming open. She has had some nausea and vomiting. The patient denies any abnormal vaginal discharge or bleeding. No change in urination or defecation. The patient denies any fevers.     Physical Exam   Triage Vital Signs: ED Triage Vitals  Enc Vitals Group     BP 07/22/21 2151 131/88     Pulse Rate 07/22/21 2151 78     Resp 07/22/21 2151 18     Temp 07/22/21 2151 99.6 F (37.6 C)     Temp Source 07/22/21 2151 Oral     SpO2 07/22/21 2151 95 %     Weight 07/22/21 2202 (!) 304 lb (137.9 kg)     Height 07/22/21 2202 5\' 3"  (1.6 m)     Head Circumference --      Peak Flow --      Pain Score 07/22/21 2202 8   Most recent vital signs: Vitals:   07/22/21 2151  BP: 131/88  Pulse: 78  Resp: 18  Temp: 99.6 F (37.6 C)  SpO2: 95%   General: Awake, alert, oriented. CV:  Good peripheral perfusion. Regular rate and rhythm. Resp:  Normal effort. Lungs clear. Abd:  Slightly tender to palpation across the lower abdomen. No CVA tenderness.    ED Results / Procedures / Treatments   Labs (all labs ordered are listed, but only abnormal results are displayed) Labs Reviewed  COMPREHENSIVE METABOLIC PANEL - Abnormal; Notable for the following components:      Result Value   Potassium 3.3 (*)    Calcium 8.5 (*)    Total Protein 6.4 (*)    Albumin  2.9 (*)    Total Bilirubin 0.2 (*)    All other components within normal limits  CBC - Abnormal; Notable for the following components:   RBC 3.22 (*)    Hemoglobin 9.5 (*)    HCT 29.2 (*)    All other components within normal limits  LIPASE, BLOOD  URINALYSIS, ROUTINE W REFLEX MICROSCOPIC  HCG, QUANTITATIVE, PREGNANCY     EKG  None   RADIOLOGY 07/24/21 pending   PROCEDURES:  Critical Care performed: No  Procedures   MEDICATIONS ORDERED IN ED: Medications - No data to display   IMPRESSION / MDM / ASSESSMENT AND PLAN / ED COURSE  I reviewed the triage vital signs and the nursing notes.                              Differential diagnosis includes, but is not limited to, pain secondary to pregnancy, miscarriage, UTI.  Patient's presentation is most consistent with acute presentation with potential threat to life or bodily function.  Patient presented to the emergency department today because of concern for abdominal pain in the setting of 18 weeks pregnancy. The patient  did have tenderness across her lower abdomen. Given concern for possible miscarriage [place] was ordered. Blood work without concerning leukocytosis, and patient is afebrile, thus I have lower suspicion for appendicitis. [place] pending at time of sign out.  FINAL CLINICAL IMPRESSION(S) / ED DIAGNOSES   Final diagnoses:  Abdominal pain, unspecified abdominal location      Note:  This document was prepared using Dragon voice recognition software and may include unintentional dictation errors.    Phineas Semen, MD 07/22/21 [phone]

## 2021-07-22 NOTE — ED Notes (Signed)
FHTs 145 on doppler.

## 2021-07-22 NOTE — ED Provider Notes (Signed)
-----------------------------------------   11:01 PM on 07/22/2021 -----------------------------------------  Assuming care from Dr. Derrill Kay.  In short, Christine Cline is a 36 y.o. female with a chief complaint of abdominal pain during pregnancy.  Refer to the original H&P for additional details.  The current plan of care is to follow up on U/S and reassess.   Clinical Course as of 07/23/21 0335  Thu Jul 23, 2021  0200 I viewed and interpreted the patient's transabdominal ultrasound.  I can visualize the fetus and there is no obvious acute abnormality.  Radiology report agrees.  However I reassessed the patient and she says that she still feels the abdominal pain.  On exam, her morbid obesity limits somewhat the sensitivity of the exam, but she has point tenderness to palpation of the right lower quadrant even though she says that it hurts all throughout her lower abdomen.  On repeated examination, she has no tenderness to palpation of the left lower quadrant nor the suprapubic region, but she guards and protects her right lower quadrant.    Although she has no fever nor leukocytosis, I feel it is necessary to obtain an MRI of the abdomen and pelvis to rule out appendicitis, given that it could be an acute life-threatening condition for both herself and her fetus if it goes undiagnosed.  I talked her about this and she agrees with the plan.  I consulted with Dr. Rito Ehrlich (radiology) by phone to determine the appropriate imaging protocol and I spoke directly in person with the MRI technologist.  We will proceed with the scan at this time. [CF]  3382 MR ABDOMEN WO CONTRAST I viewed and interpreted the patient's MRI of the abdomen and pelvis.  I could see no sign of inflammation and was able to visualize the fetus.  The radiologist confirmed that there is no evidence of appendicitis.  I have dated the patient with the information and she is comfortable with the plan for discharge and  outpatient follow-up.  I gave my usual and customary return precautions. [CF]    Clinical Course User Index [CF] Christine Rose, MD   Final diagnoses:  Abdominal pain during pregnancy in second trimester      Christine Rose, MD 07/23/21 [phone]

## 2021-07-23 ENCOUNTER — Emergency Department: Payer: Self-pay

## 2021-07-23 NOTE — ED Notes (Signed)
Discharge instructions discussed with pt and s/o at bedside. Pt was able to verbalize understanding with no questions at this time. Pt wheelchair to car.

## 2021-07-23 NOTE — ED Notes (Signed)
Patient transported to MRI 

## 2021-08-10 ENCOUNTER — Ambulatory Visit: Payer: Self-pay

## 2021-08-26 ENCOUNTER — Encounter: Payer: Self-pay | Admitting: Advanced Practice Midwife

## 2021-08-26 DIAGNOSIS — O10919 Unspecified pre-existing hypertension complicating pregnancy, unspecified trimester: Secondary | ICD-10-CM | POA: Insufficient documentation

## 2021-08-26 HISTORY — DX: Unspecified pre-existing hypertension complicating pregnancy, unspecified trimester: O10.919

## 2021-09-25 ENCOUNTER — Encounter: Payer: Self-pay | Admitting: Advanced Practice Midwife
